# Patient Record
Sex: Female | Born: 1965 | Race: Black or African American | Hispanic: Yes | Smoking: Heavy tobacco smoker
Health system: Southern US, Community
[De-identification: ages and names within clinical notes are randomized; demographics above are authoritative.]

## PROBLEM LIST (undated history)

## (undated) HISTORY — PX: BACK SURGERY: SHX140

---

## 2013-06-17 ENCOUNTER — Emergency Department (HOSPITAL_COMMUNITY): Payer: Non-veteran care

## 2013-06-17 ENCOUNTER — Encounter (HOSPITAL_COMMUNITY): Payer: Self-pay | Admitting: Emergency Medicine

## 2013-06-17 ENCOUNTER — Emergency Department (HOSPITAL_COMMUNITY)
Admission: EM | Admit: 2013-06-17 | Discharge: 2013-06-17 | Disposition: A | Discharge status: Home / Self Care | Payer: Non-veteran care | Attending: Emergency Medicine | Admitting: Emergency Medicine

## 2013-06-17 DIAGNOSIS — F17200 Nicotine dependence, unspecified, uncomplicated: Secondary | ICD-10-CM | POA: Insufficient documentation

## 2013-06-17 DIAGNOSIS — N83209 Unspecified ovarian cyst, unspecified side: Secondary | ICD-10-CM

## 2013-06-17 DIAGNOSIS — Z3202 Encounter for pregnancy test, result negative: Secondary | ICD-10-CM | POA: Insufficient documentation

## 2013-06-17 LAB — CBC WITH DIFFERENTIAL/PLATELET
BASOS PCT: 0 % (ref 0–1)
Basophils Absolute: 0 10*3/uL (ref 0.0–0.1)
EOS ABS: 0.1 10*3/uL (ref 0.0–0.7)
Eosinophils Relative: 1 % (ref 0–5)
HCT: 36.3 % (ref 36.0–46.0)
Hemoglobin: 12.6 g/dL (ref 12.0–15.0)
Lymphocytes Relative: 18 % (ref 12–46)
Lymphs Abs: 2 10*3/uL (ref 0.7–4.0)
MCH: 31.1 pg (ref 26.0–34.0)
MCHC: 34.7 g/dL (ref 30.0–36.0)
MCV: 89.6 fL (ref 78.0–100.0)
Monocytes Absolute: 0.5 10*3/uL (ref 0.1–1.0)
Monocytes Relative: 4 % (ref 3–12)
NEUTROS PCT: 76 % (ref 43–77)
Neutro Abs: 8.2 10*3/uL — ABNORMAL HIGH (ref 1.7–7.7)
PLATELETS: 413 10*3/uL — AB (ref 150–400)
RBC: 4.05 MIL/uL (ref 3.87–5.11)
RDW: 13.1 % (ref 11.5–15.5)
WBC: 10.8 10*3/uL — ABNORMAL HIGH (ref 4.0–10.5)

## 2013-06-17 LAB — URINALYSIS, ROUTINE W REFLEX MICROSCOPIC
Bilirubin Urine: NEGATIVE
GLUCOSE, UA: NEGATIVE mg/dL
HGB URINE DIPSTICK: NEGATIVE
Ketones, ur: 15 mg/dL — AB
Leukocytes, UA: NEGATIVE
Nitrite: NEGATIVE
Protein, ur: NEGATIVE mg/dL
Specific Gravity, Urine: 1.009 (ref 1.005–1.030)
Urobilinogen, UA: 0.2 mg/dL (ref 0.0–1.0)
pH: 7.5 (ref 5.0–8.0)

## 2013-06-17 LAB — WET PREP, GENITAL
Trich, Wet Prep: NONE SEEN
Yeast Wet Prep HPF POC: NONE SEEN

## 2013-06-17 LAB — COMPREHENSIVE METABOLIC PANEL
ALBUMIN: 4.1 g/dL (ref 3.5–5.2)
ALK PHOS: 64 U/L (ref 39–117)
ALT: 16 U/L (ref 0–35)
AST: 22 U/L (ref 0–37)
BUN: 7 mg/dL (ref 6–23)
CO2: 24 mEq/L (ref 19–32)
Calcium: 9.5 mg/dL (ref 8.4–10.5)
Chloride: 102 mEq/L (ref 96–112)
Creatinine, Ser: 0.88 mg/dL (ref 0.50–1.10)
GFR calc Af Amer: 90 mL/min — ABNORMAL LOW (ref >90)
GFR calc non Af Amer: 78 mL/min — ABNORMAL LOW (ref >90)
Glucose, Bld: 115 mg/dL — ABNORMAL HIGH (ref 70–99)
POTASSIUM: 3.4 meq/L — AB (ref 3.7–5.3)
SODIUM: 138 meq/L (ref 137–147)
TOTAL PROTEIN: 7.4 g/dL (ref 6.0–8.3)
Total Bilirubin: 0.5 mg/dL (ref 0.3–1.2)

## 2013-06-17 LAB — POC URINE PREG, ED: Preg Test, Ur: NEGATIVE

## 2013-06-17 LAB — LIPASE, BLOOD: LIPASE: 15 U/L (ref 11–59)

## 2013-06-17 MED ORDER — METRONIDAZOLE 500 MG PO TABS: 500.0000 mg | ORAL_TABLET | Freq: Two times a day (BID) | ORAL | Status: AC

## 2013-06-17 MED ORDER — HYDROCODONE-ACETAMINOPHEN 5-325 MG PO TABS: 1.0000 | ORAL_TABLET | Freq: Four times a day (QID) | ORAL | Status: AC | PRN

## 2013-06-17 NOTE — Discharge Instructions (Signed)
Follow up with your GYn. Return here as needed.

## 2013-06-17 NOTE — ED Notes (Signed)
Bed: WA10 Expected date:  Expected time:  Means of arrival:  Comments: EMS abd pain  

## 2013-06-17 NOTE — ED Notes (Signed)
Per EMS patient reports to ED, has been eating more salads than usual, has had stools which are more loose than usual, patient states that today she had a more solid stool, after which she had sharp lower left quadrant abdominal pain radiating to rectum.

## 2013-06-17 NOTE — ED Provider Notes (Signed)
CSN: [id number]     Arrival date & time 06/17/13  1656 History   First MD Initiated Contact with Patient 06/17/13 1750     Chief Complaint  Patient presents with  . Abdominal Pain     (Consider location/radiation/quality/duration/timing/severity/associated sxs/prior Treatment) Patient is a 48 y.o. female presenting with abdominal pain.  Abdominal Pain : Jacqueline Cherry is a 48 year old female who presents to the Emergency Department with severe lower abdominal pain.  She reports that the pain started exactly at 1:45 this afternoon while she was urinating.  She states that the pain was so severe at first that she could hardly walk out of the bathroom.  Since, the pain has come and gone in waves and ranges from 5-6/10 to a 9/10 in severity.  She describes the pain as feeling like something is ripping through from the "left lower quadrant" just above her C-section scar through her anus.  She has not taken anything to help with the pain.  She says that the pain is worse sitting up and better lying down.  She has had diarrhea for 2-3 days with associated nausea, but no vomiting.  She had no abdominal pain before today.  She denies fevers and dysuria.  Her LMP was 2 weeks ago; she is sexually active with one long term partner and has a history of tubal ligation.    History reviewed. No pertinent past medical history. Past Surgical History  Procedure Laterality Date  . Back surgery     No family history on file. History  Substance Use Topics  . Smoking status: Heavy Tobacco Smoker -- 0.50 packs/day for 25 years  . Smokeless tobacco: Not on file  . Alcohol Use: No   OB History   Grav Para Term Preterm Abortions TAB SAB Ect Mult Living                 Review of Systems  Gastrointestinal: Positive for abdominal pain.      Allergies  Review of patient's allergies indicates no known allergies.  Home Medications   Current Outpatient Rx  Name  Route  Sig  Dispense  Refill  .  cyclobenzaprine (FLEXERIL) 10 MG tablet   Oral   Take 10 mg by mouth daily as needed for muscle spasms.         Marland Kitchen ibuprofen (ADVIL,MOTRIN) 800 MG tablet   Oral   Take 800 mg by mouth every 8 (eight) hours as needed for moderate pain.         . naproxen (NAPROSYN) 500 MG tablet   Oral   Take 500 mg by mouth as needed for moderate pain.         . pseudoephedrine-acetaminophen (TYLENOL SINUS) 30-500 MG TABS   Oral   Take 2 tablets by mouth every 6 (six) hours as needed (migraine).          BP 159/104  Pulse 87  Temp(Src) 98.6 F (37 C) (Oral)  Resp 12  Ht 5\' 6"  (1.676 m)  Wt 184 lb (83.462 kg)  BMI 29.71 kg/m2  SpO2 100%  LMP 06/06/2013 Physical Exam  Nursing note and vitals reviewed. Constitutional: She is oriented to person, place, and time. She appears well-developed and well-nourished. No distress.  HENT:  Head: Normocephalic and atraumatic.  Mouth/Throat: Oropharynx is clear and moist.  Eyes: Pupils are equal, round, and reactive to light.  Neck: Normal range of motion. Neck supple.  Cardiovascular: Normal rate, regular rhythm and normal heart sounds.  Exam reveals no gallop and no friction rub.   No murmur heard. Pulmonary/Chest: Effort normal and breath sounds normal. No respiratory distress. She has no wheezes.  Abdominal: Soft. Bowel sounds are normal. She exhibits no distension. There is tenderness. There is no rebound and no guarding.  Point tenderness to light palpation left of the midline above C-section surgical scar.  Diffuse tenderness on left lower quadrant.  Neurological: She is alert and oriented to person, place, and time. She exhibits normal muscle tone. Coordination normal.  Skin: Skin is warm and dry. No rash noted.    ED Course  Procedures (including critical care time) Labs Review Labs Reviewed  CBC WITH DIFFERENTIAL - Abnormal; Notable for the following:    WBC 10.8 (*)    Platelets 413 (*)    Neutro Abs 8.2 (*)    All other  components within normal limits  COMPREHENSIVE METABOLIC PANEL - Abnormal; Notable for the following:    Potassium 3.4 (*)    Glucose, Bld 115 (*)    GFR calc non Af Amer 78 (*)    GFR calc Af Amer 90 (*)    All other components within normal limits  LIPASE, BLOOD  URINALYSIS, ROUTINE W REFLEX MICROSCOPIC  POC URINE PREG, ED     The patient is advised of her laboratory tests findings and CT scan results She has an appointment with a GI Dr. told to return here as needed.  Jacqueline Dollyhristopher W Aava Deland, PA-C 06/19/13 [phone]

## 2013-06-18 LAB — GC/CHLAMYDIA PROBE AMP
CT Probe RNA: NEGATIVE
GC Probe RNA: NEGATIVE

## 2013-06-19 NOTE — ED Provider Notes (Signed)
Medical screening examination/treatment/procedure(s) were performed by non-physician practitioner and as supervising physician I was immediately available for consultation/collaboration.   EKG Interpretation None       Martha K Linker, MD 06/19/13 1500 

## 2015-02-23 IMAGING — US US PELVIS COMPLETE
1 series · 13 of 25 positions shown · non-contrast
Comparison: None

CLINICAL DATA: Sudden onset left lower quadrant pain

EXAM:
TRANSABDOMINAL AND TRANSVAGINAL ULTRASOUND OF PELVIS
DOPPLER ULTRASOUND OF OVARIES
TECHNIQUE: Both transabdominal and transvaginal ultrasound examinations of the
pelvis were performed. Transabdominal technique was performed for
global imaging of the pelvis including uterus, ovaries, adnexal
regions, and pelvic cul-de-sac.

[Series 1: us pelvis complete · 0.17mm/px · 95 acquisitions, 13 frames shown]
[im 1/95]
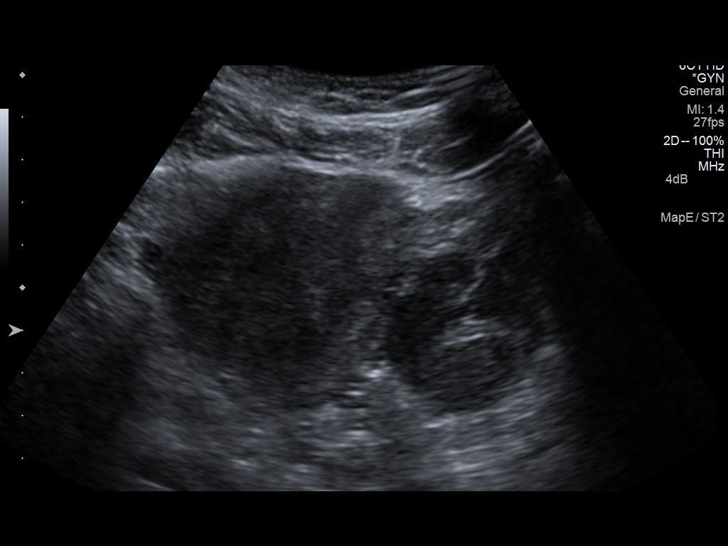
[im 8/95]
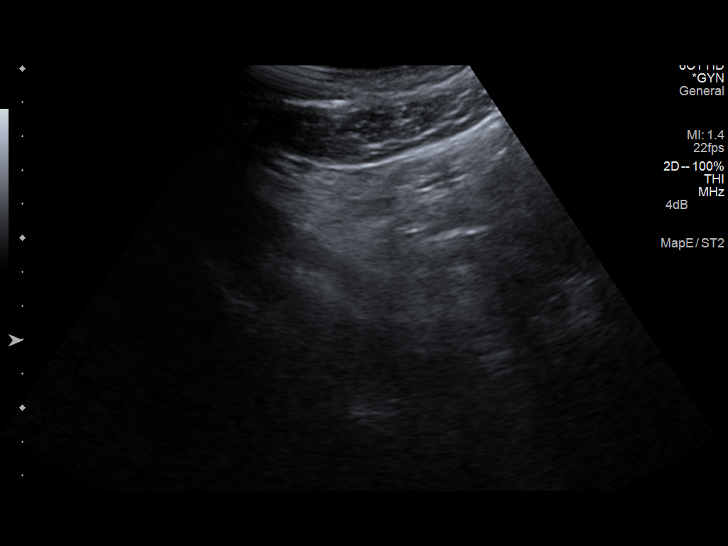
[im 16/95]
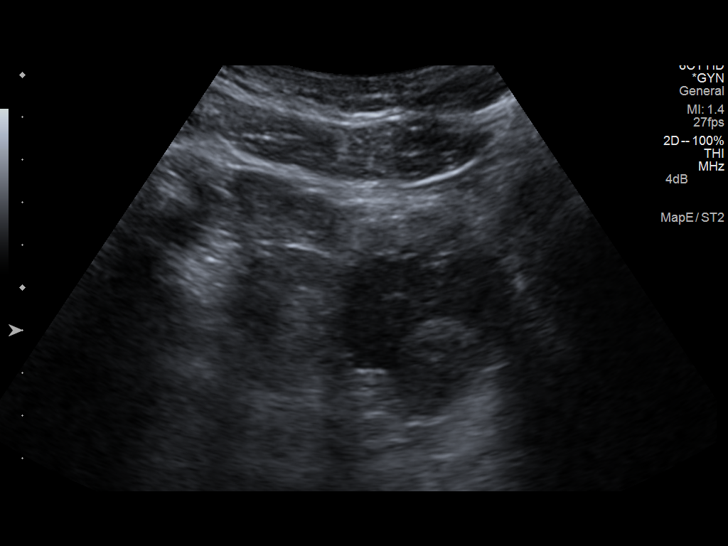
[im 24/95]
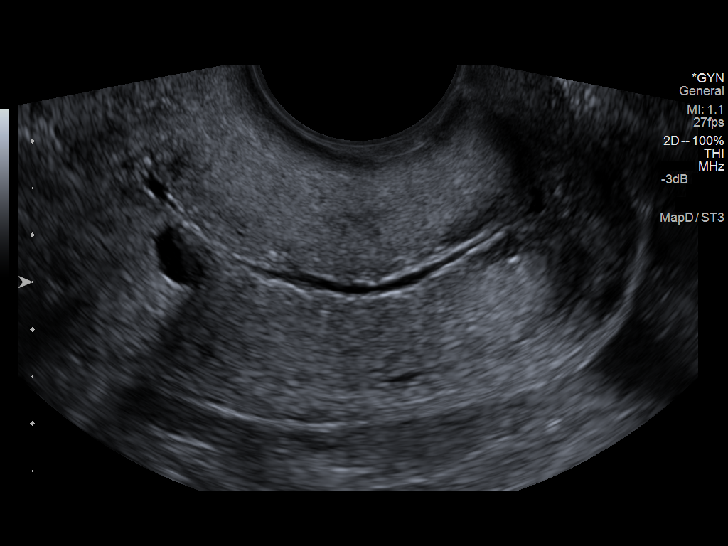
[im 32/95]
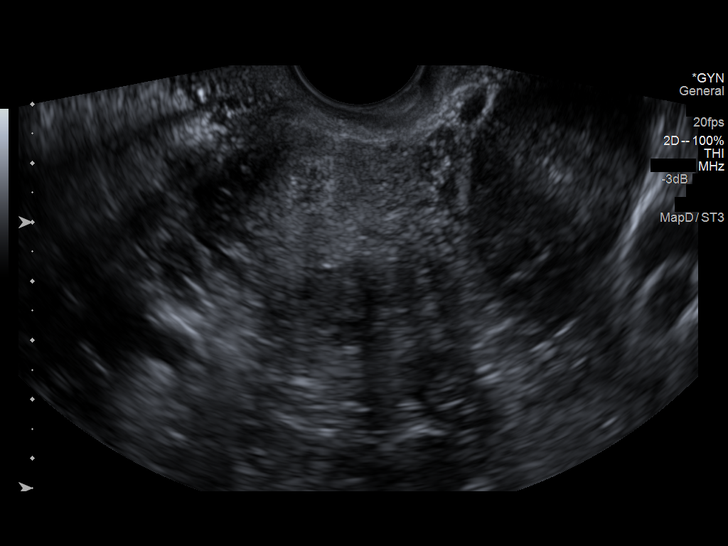
[im 40/95]
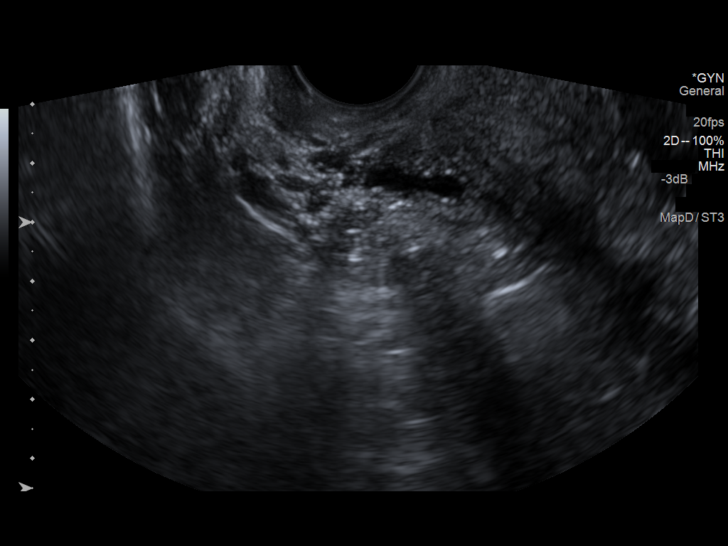
[im 48/95]
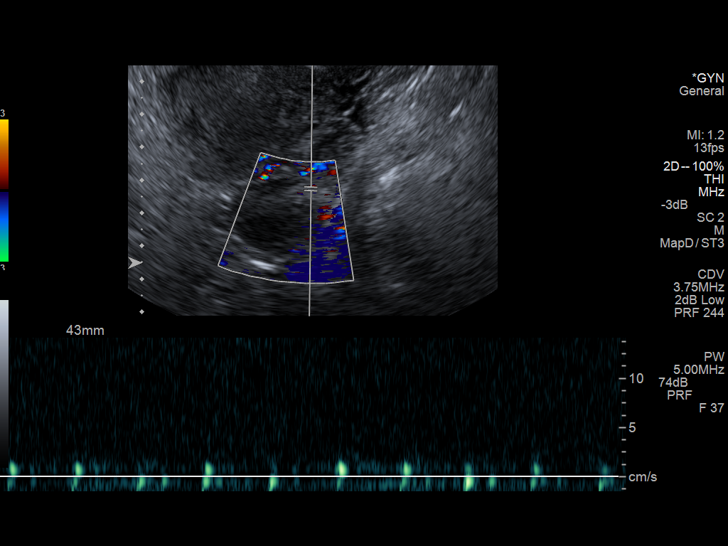
[im 55/95]
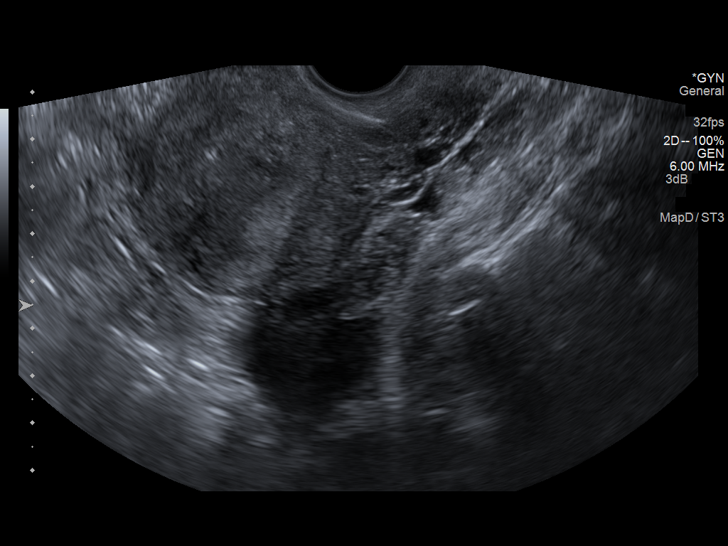
[im 63/95]
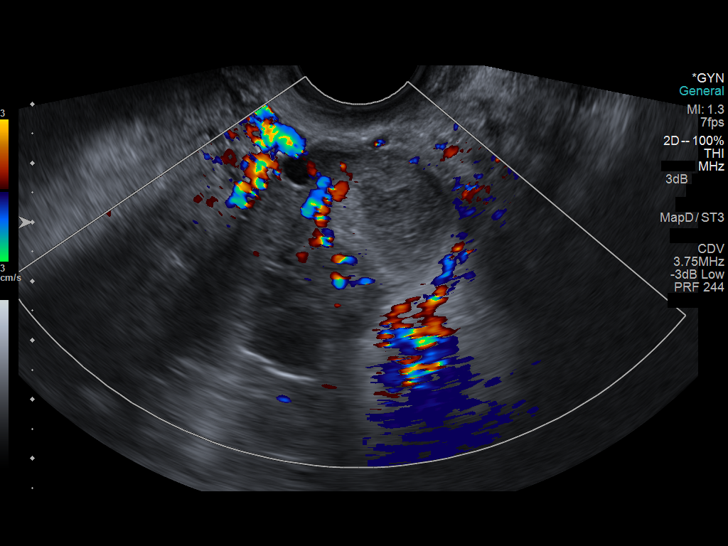
[im 71/95]
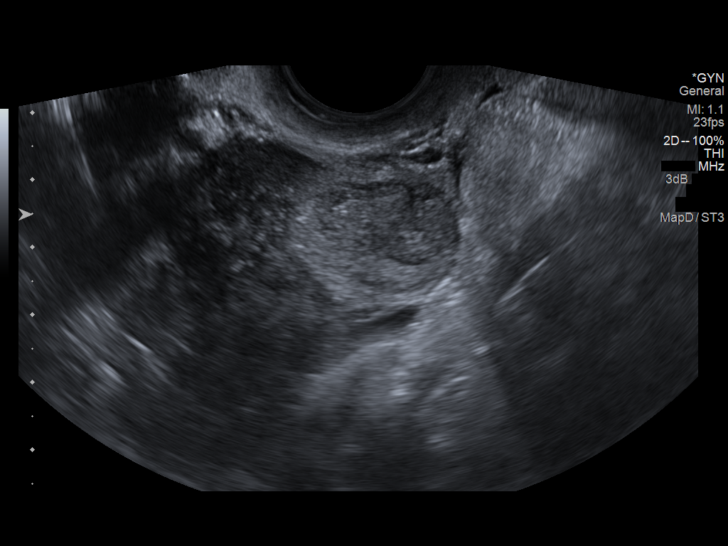
[im 79/95]
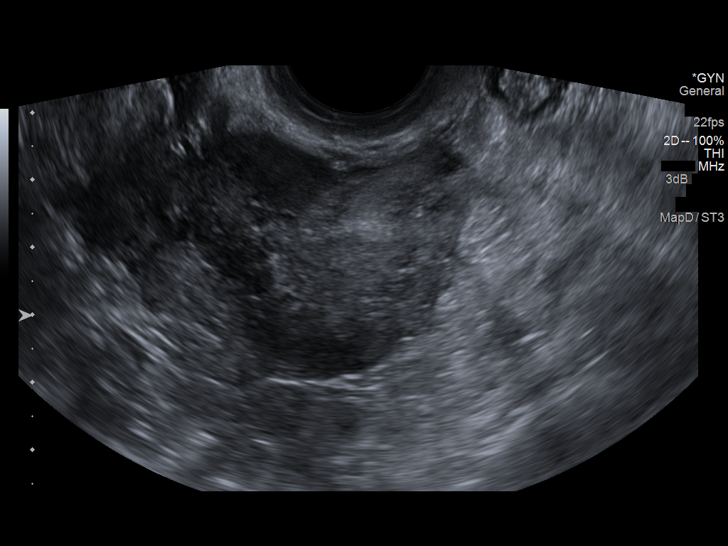
[im 87/95]
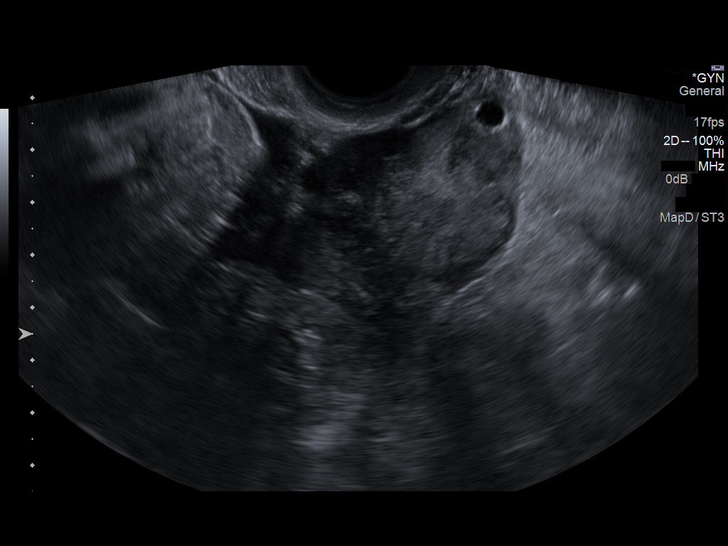
[im 95/95]
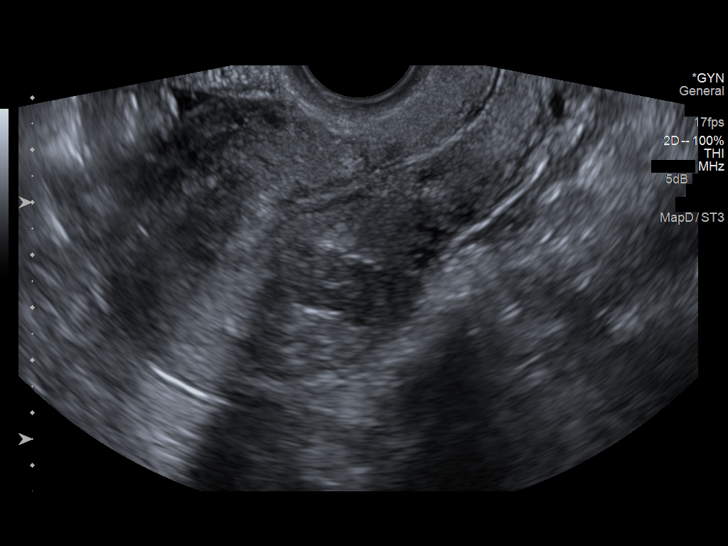

[13 of 25 positions shown; findings below may reference images not displayed]

It was necessary to proceed with endovaginal exam following the
transabdominal exam to visualize the uterus, endometrium, ovaries
and adnexae. Transabdominal images severely limited by inadequate
bladder distention and poor acoustic window. Color and duplex
Doppler ultrasound was utilized to evaluate blood flow to the
ovaries.
FINDINGS: Uterus

Measurements: 11.4 x 5.6 x 6.5 cm. Large nabothian cyst is cervix.
Probable intramural leiomyomata, largest at fundus 1.8 x 1.9 x
cm.

Endometrium

Thickness: 3 mm thick, normal. Small amount of endometrial fluid
noted at lower uterine segment and endocervical canal.

Right ovary

Measurements: 5.1 x 2.8 x 2.9 cm. Normal morphology without mass.
Internal blood flow present on color Doppler imaging.

Left ovary

Measurements: 5.3 x 2.6 x 3.8 cm. Complex isoechoic to mildly
hypoechoic nodule within left ovary 2.1 x 2.0 x 2.4 cm, lacking
internal blood flow, favor hemorrhagic cyst. Internal blood flow
present within left ovary on color Doppler imaging.

Pulsed Doppler evaluation of demonstrates low resistance arterial
and venous flow are seen within the left ovary on pulse Doppler
imaging. Doppler assessment of the right ovary is suboptimal due to
position and depth, with venous flow and limited arterial flow
identified.

Other findings

Small amount of free fluid in pelvis, some which appears complicated
question blood
IMPRESSION: Small uterine leiomyomata.

Normal appearing right ovary.

Probable small hemorrhagic cyst of the left ovary 2.4 cm in greatest
size with small amount of complex fluid in the adjacent left adnexa,
question blood.

No evidence of left ovarian torsion ; suboptimal Doppler evaluation
of the right ovary due to position.
